# Patient Record
Sex: Male | Born: 1980 | Race: White | Hispanic: No | Marital: Single | Smoking: Never smoker
Health system: Southern US, Community
[De-identification: ages and names within clinical notes are randomized; demographics above are authoritative.]

---

## 2001-03-02 ENCOUNTER — Emergency Department (HOSPITAL_COMMUNITY): Admission: EM | Admit: 2001-03-02 | Discharge: 2001-03-02 | Payer: Self-pay | Admitting: Emergency Medicine

## 2001-03-02 ENCOUNTER — Encounter: Payer: Self-pay | Admitting: Emergency Medicine

## 2004-10-27 ENCOUNTER — Emergency Department (HOSPITAL_COMMUNITY): Admission: EM | Admit: 2004-10-27 | Discharge: 2004-10-27 | Payer: Self-pay | Admitting: Emergency Medicine

## 2013-08-05 ENCOUNTER — Encounter (INDEPENDENT_AMBULATORY_CARE_PROVIDER_SITE_OTHER): Payer: Self-pay

## 2013-08-05 ENCOUNTER — Ambulatory Visit: Payer: Self-pay | Admitting: Family Medicine

## 2013-08-05 ENCOUNTER — Ambulatory Visit (INDEPENDENT_AMBULATORY_CARE_PROVIDER_SITE_OTHER): Payer: Self-pay

## 2013-08-05 VITALS — BP 135/86 | HR 77 | Temp 98.2°F | Ht 68.5 in | Wt 198.2 lb

## 2013-08-05 DIAGNOSIS — F1010 Alcohol abuse, uncomplicated: Secondary | ICD-10-CM

## 2013-08-05 DIAGNOSIS — R1011 Right upper quadrant pain: Secondary | ICD-10-CM

## 2013-08-05 DIAGNOSIS — R1013 Epigastric pain: Secondary | ICD-10-CM

## 2013-08-05 DIAGNOSIS — R0602 Shortness of breath: Secondary | ICD-10-CM

## 2013-08-05 DIAGNOSIS — K625 Hemorrhage of anus and rectum: Secondary | ICD-10-CM

## 2013-08-05 DIAGNOSIS — R5381 Other malaise: Secondary | ICD-10-CM

## 2013-08-05 DIAGNOSIS — R1032 Left lower quadrant pain: Secondary | ICD-10-CM

## 2013-08-05 DIAGNOSIS — R5383 Other fatigue: Secondary | ICD-10-CM

## 2013-08-05 LAB — POCT URINALYSIS DIPSTICK
Bilirubin, UA: NEGATIVE
Glucose, UA: NEGATIVE
Ketones, UA: NEGATIVE
Leukocytes, UA: NEGATIVE
Nitrite, UA: NEGATIVE
Spec Grav, UA: 1.01
Urobilinogen, UA: NEGATIVE
pH, UA: 6

## 2013-08-05 LAB — POCT CBC
Granulocyte percent: 65.9 %G (ref 37–80)
HCT, POC: 43.1 % — AB (ref 43.5–53.7)
Hemoglobin: 14.3 g/dL (ref 14.1–18.1)
Lymph, poc: 2 (ref 0.6–3.4)
MCH, POC: 30.4 pg (ref 27–31.2)
MCHC: 33.1 g/dL (ref 31.8–35.4)
MCV: 91.7 fL (ref 80–97)
MPV: 7.5 fL (ref 0–99.8)
POC Granulocyte: 4.8 (ref 2–6.9)
POC LYMPH PERCENT: 27.5 %L (ref 10–50)
Platelet Count, POC: 272 10*3/uL (ref 142–424)
RBC: 4.7 M/uL (ref 4.69–6.13)
RDW, POC: 12.1 %
WBC: 7.3 10*3/uL (ref 4.6–10.2)

## 2013-08-05 LAB — POCT UA - MICROSCOPIC ONLY
Casts, Ur, LPF, POC: NEGATIVE
Crystals, Ur, HPF, POC: NEGATIVE
Mucus, UA: NEGATIVE
Yeast, UA: NEGATIVE

## 2013-08-05 MED ORDER — VITAMIN B-1 100 MG PO TABS: 100.0000 mg | ORAL_TABLET | Freq: Every day | ORAL | Status: AC

## 2013-08-05 NOTE — Progress Notes (Signed)
   Subjective:    Patient ID: David Landry, male    DOB: 1980-10-26, 33 y.o.   MRN: [id number]  HPI  This 33 y.o. male presents for evaluation of epigastric abdominal pain and right upper quadrant Abdominal discomfort.  He states he has been alcoholic since 33 years old.  He drinks 6 beers and 1/2 fifth a night 7 days a week.  He had some abdominal pain in his RUQ a few years ago after heavy binge drinking.  He states he feels like he has a knot in his epigastric region.  He has been having a lot of right upper quadrant abdominal pains.  He states he feels bad.  He c/o occasional rectal bleeding and difficulty with his bowels he states.  He states it hurts to take deep breath or run because of pain in his right upper quadrant.  Review of Systems C/o abdominal pain   No chest pain, SOB, HA, dizziness, vision change, N/V, diarrhea, constipation, dysuria, urinary urgency or frequency, myalgias, arthralgias or rash.  Objective:   Physical Exam  Vital signs noted  Well developed well nourished male.  HEENT - Head atraumatic Normocephalic                Eyes - PERRLA, Conjuctiva - clear Sclera- Clear EOMI                Ears - EAC's Wnl TM's Wnl Gross Hearing WNL                Throat - oropharanx wnl Respiratory - Lungs CTA bilateral Cardiac - RRR S1 and S2 without murmur GI - Abdomen tenderness with palpation to epigastric region and right upper quadrant, and bowel sounds active x 4 Extremities - No edema. Neuro - Grossly intact.  CXR - No infiltrates KUB - Normal gas pattern  Prelimnary reading by Iverson Alamin     Assessment & Plan:    Abdominal pain, right upper quadrant - Plan: POCT CBC, CMP14+EGFR, Lipase, POCT urinalysis dipstick, Sedimentation rate, US Abdomen Complete, DG Abd 1 View.  Concerned that he may have pancreatitis and recommend no more alcohol.  Abdominal pain, epigastric - Plan: POCT CBC, CMP14+EGFR, Lipase, POCT urinalysis dipstick, POCT UA - Microscopic  Only, Sedimentation rate, US Abdomen Complete, DG Abd 1 View. Dexilant $RemoveBefore'60mg'[id number]$  one po qd #6 samples.  Other malaise and fatigue - Plan: POCT CBC, CMP14+EGFR, Lipase, POCT urinalysis dipstick, POCT UA - Microscopic Only, Sedimentation rate, Thyroid Panel With TSH  Rectal bleeding - Plan: POCT CBC, CMP14+EGFR, Lipase, POCT urinalysis dipstick, POCT UA - Microscopic Only, US Abdomen Complete, POCT INR  Shortness of breath - Plan: POCT CBC, CMP14+EGFR, Lipase, POCT urinalysis dipstick, POCT UA - Microscopic Only, Sedimentation rate, DG Chest 2 View  Alcohol abuse - Plan: POCT CBC, CMP14+EGFR, Lipase, POCT urinalysis dipstick, POCT UA - Microscopic Only, Folate Strongly encouraged patient that he needs to quit drinking and that he may die if he does not stop. Recommend rehab if cannot quit drinking.  Follow up in 4 days  Lysbeth Penner FNP

## 2013-08-06 ENCOUNTER — Telehealth: Payer: Self-pay | Admitting: Family Medicine

## 2013-08-06 ENCOUNTER — Telehealth: Payer: Self-pay | Admitting: *Deleted

## 2013-08-06 LAB — CMP14+EGFR
ALT: 53 IU/L — ABNORMAL HIGH (ref 0–44)
AST: 44 IU/L — ABNORMAL HIGH (ref 0–40)
Albumin/Globulin Ratio: 2.1 (ref 1.1–2.5)
Albumin: 5.1 g/dL (ref 3.5–5.5)
Alkaline Phosphatase: 63 IU/L (ref 39–117)
BUN/Creatinine Ratio: 12 (ref 8–19)
BUN: 13 mg/dL (ref 6–20)
CO2: 26 mmol/L (ref 18–29)
Calcium: 10 mg/dL (ref 8.7–10.2)
Chloride: 97 mmol/L (ref 97–108)
Creatinine, Ser: 1.08 mg/dL (ref 0.76–1.27)
GFR calc Af Amer: 103 mL/min/{1.73_m2} (ref >59)
GFR calc non Af Amer: 89 mL/min/{1.73_m2} (ref >59)
Globulin, Total: 2.4 g/dL (ref 1.5–4.5)
Glucose: 85 mg/dL (ref 65–99)
Potassium: 4.8 mmol/L (ref 3.5–5.2)
Sodium: 140 mmol/L (ref 134–144)
Total Bilirubin: 0.4 mg/dL (ref 0.0–1.2)
Total Protein: 7.5 g/dL (ref 6.0–8.5)

## 2013-08-06 LAB — SEDIMENTATION RATE

## 2013-08-06 LAB — LIPASE: Lipase: 21 U/L (ref 0–59)

## 2013-08-06 NOTE — Addendum Note (Signed)
Addended by: Prescott GumLAND, Ezekial Arns M on: 08/06/2013 04:45 PM   Modules accepted: Orders

## 2013-08-06 NOTE — Telephone Encounter (Signed)
Message copied by Gwenith DailyHUDY, KRISTEN N on Tue Aug 06, 2013 12:23 PM ------      Message from: Deatra CanterXFORD, WILLIAM J      Created: Tue Aug 06, 2013  8:29 AM       LFT elevated and need to get Liver US.  Need hepatitis panel.  4 plus protien in urine and moderate blood.  Would like to get 24 hour urine for protien and then also get UA cx and also repeat UA.      Patient is coming in Friday for follow up and will discuss with him at follow up. ------

## 2013-08-06 NOTE — Telephone Encounter (Signed)
appt scheduled for friday 

## 2013-08-07 LAB — HEPATITIS PANEL, ACUTE
Hep A IgM: NEGATIVE
Hep B C IgM: NEGATIVE
Hep C Virus Ab: <0.1 s/co ratio (ref 0.0–0.9)
Hepatitis B Surface Ag: NEGATIVE

## 2013-08-07 LAB — URINE CULTURE: Organism ID, Bacteria: NO GROWTH

## 2013-08-07 LAB — SEDIMENTATION RATE: Sed Rate: 6 mm/hr (ref 0–15)

## 2013-08-08 ENCOUNTER — Other Ambulatory Visit: Payer: Self-pay

## 2013-08-08 ENCOUNTER — Ambulatory Visit (HOSPITAL_COMMUNITY): Payer: Self-pay

## 2013-08-08 DIAGNOSIS — F1010 Alcohol abuse, uncomplicated: Secondary | ICD-10-CM

## 2013-08-08 DIAGNOSIS — K625 Hemorrhage of anus and rectum: Secondary | ICD-10-CM

## 2013-08-08 DIAGNOSIS — R1013 Epigastric pain: Secondary | ICD-10-CM

## 2013-08-08 DIAGNOSIS — R5381 Other malaise: Secondary | ICD-10-CM

## 2013-08-08 DIAGNOSIS — R5383 Other fatigue: Secondary | ICD-10-CM

## 2013-08-08 DIAGNOSIS — R0602 Shortness of breath: Secondary | ICD-10-CM

## 2013-08-08 DIAGNOSIS — R1011 Right upper quadrant pain: Secondary | ICD-10-CM

## 2013-08-08 DIAGNOSIS — R1032 Left lower quadrant pain: Secondary | ICD-10-CM

## 2013-08-08 NOTE — Addendum Note (Signed)
Addended by: Prescott GumLAND, Ever Gustafson M on: 08/08/2013 04:23 PM   Modules accepted: Orders

## 2013-08-09 ENCOUNTER — Encounter: Payer: Self-pay | Admitting: Family Medicine

## 2013-08-09 ENCOUNTER — Ambulatory Visit: Payer: Self-pay | Admitting: Family Medicine

## 2013-08-09 VITALS — BP 137/90 | HR 75 | Temp 97.3°F | Ht 68.5 in | Wt 199.0 lb

## 2013-08-09 DIAGNOSIS — G8929 Other chronic pain: Secondary | ICD-10-CM

## 2013-08-09 DIAGNOSIS — R1011 Right upper quadrant pain: Secondary | ICD-10-CM

## 2013-08-09 DIAGNOSIS — N419 Inflammatory disease of prostate, unspecified: Secondary | ICD-10-CM

## 2013-08-09 DIAGNOSIS — F1010 Alcohol abuse, uncomplicated: Secondary | ICD-10-CM

## 2013-08-09 LAB — PROTEIN, URINE, 24 HOUR
Protein, 24H Urine: 124.8 mg/24 hr (ref 30.0–150.0)
Protein, Ur: 5.2 mg/dL (ref 0.0–15.0)

## 2013-08-09 MED ORDER — HYDROCODONE-ACETAMINOPHEN 5-325 MG PO TABS: 1.0000 | ORAL_TABLET | Freq: Four times a day (QID) | ORAL | Status: DC | PRN

## 2013-08-09 MED ORDER — CIPROFLOXACIN HCL 500 MG PO TABS: 500.0000 mg | ORAL_TABLET | Freq: Two times a day (BID) | ORAL | Status: AC

## 2013-08-09 MED ORDER — FOLIC ACID 1 MG PO TABS: 1.0000 mg | ORAL_TABLET | Freq: Every day | ORAL | Status: AC

## 2013-08-09 NOTE — Progress Notes (Signed)
   Subjective:    Patient ID: David Landry, male    DOB: 1980-05-24, 33 y.o.   MRN: [id number]  HPI This 33 y.o. male presents for evaluation of persistent abdominal pain in epigastric and right upper quadrant of abdomen.  Patient admits to heavy ETOH use and he has been trying to cut back.  He  Did have labs drawn which show mildly elevated transaminases and urinalysis showing proteinuria And hematuria.  He states he has been having some dysuria and frequency.  He has hx of prostatitis and feels like it is coming back.  He has persistent epigastric pain and right upper quadrant abdominal pain.   He had US scheduled but missed it.  He is still drinking alcohol and states he has cut back but is drinking the rest in the fridge and will not buy anymore.  He had elevated protien in his urine and a 24 hour urinalysis for protein.  Review of Systems C/o abdominal pain, dysuria, and fatigue   No chest pain, SOB, HA, dizziness, vision change, N/V, diarrhea, constipation, dysuria, urinary urgency or frequency, myalgias, arthralgias or rash.  Objective:   Physical Exam  Vital signs noted  Well developed well nourished male.  HEENT - Head atraumatic Normocephalic                Eyes - PERRLA, Conjuctiva - clear Sclera- Clear EOMI                Ears - EAC's Wnl TM's Wnl Gross Hearing WNL                Throat - oropharanx wnl Respiratory - Lungs CTA bilateral Cardiac - RRR S1 and S2 without murmur GI - Abdomen tender epigastric and RUQ.  Positive McMurray and bowel sounds active x 4 Extremities - No edema. Neuro - Grossly intact.  Results for orders placed in visit on 08/08/13  PROTEIN, URINE, 24 HOUR      Result Value Ref Range   Protein, Ur 5.2  0.0 - 15.0 mg/dL   Protein, 78G24H Urine [id number]  30.0 - 150.0 mg/24 hr      Assessment & Plan:  Alcohol abuse - Plan: folic acid (FOLVITE) 1 MG tablet.  Discussed at length that he needs to quit drinking alcohol.  Prostatitis - Plan:  ciprofloxacin (CIPRO) 500 MG tablet bid x 2 weeks.  He is having similar sx's and will go ahead and tx empirically.  Abdominal pain, chronic, right upper quadrant - Plan: HYDROcodone-acetaminophen (NORCO) 5-325 MG per tablet.  Get abdominal US ASAP.  Called [facility] and scheduled it for Tuesday am And need him to make this appointment.  Follow up in ED if pain worsens.  Concerned it may be gallbladder.  Labs show no infection.    Gerd - Continue Dexilant 60mg  po qd samples were given last visit.  Proteinuria - Protein within normal limits.  Since he is having UA sx's will tx with abx's and repeat UA after tx.  Follow up after BG US.  David CanterWilliam J Haylee Mcanany FNP

## 2013-08-12 ENCOUNTER — Telehealth: Payer: Self-pay | Admitting: Family Medicine

## 2013-08-12 NOTE — Telephone Encounter (Signed)
appt given for wed with bill 

## 2013-08-13 ENCOUNTER — Ambulatory Visit (HOSPITAL_COMMUNITY)
Admission: RE | Admit: 2013-08-13 | Discharge: 2013-08-13 | Disposition: A | Discharge status: Home / Self Care | Payer: Self-pay | Source: Ambulatory Visit | Attending: Family Medicine | Admitting: Family Medicine

## 2013-08-13 DIAGNOSIS — K7689 Other specified diseases of liver: Secondary | ICD-10-CM | POA: Insufficient documentation

## 2013-08-13 DIAGNOSIS — R1013 Epigastric pain: Secondary | ICD-10-CM | POA: Insufficient documentation

## 2013-08-13 DIAGNOSIS — R1011 Right upper quadrant pain: Secondary | ICD-10-CM | POA: Insufficient documentation

## 2013-08-13 DIAGNOSIS — K625 Hemorrhage of anus and rectum: Secondary | ICD-10-CM

## 2013-08-14 ENCOUNTER — Ambulatory Visit (INDEPENDENT_AMBULATORY_CARE_PROVIDER_SITE_OTHER): Payer: Self-pay | Admitting: Family Medicine

## 2013-08-14 ENCOUNTER — Encounter: Payer: Self-pay | Admitting: Family Medicine

## 2013-08-14 VITALS — BP 130/82 | HR 76 | Temp 98.2°F | Ht 68.5 in | Wt 199.2 lb

## 2013-08-14 DIAGNOSIS — R1011 Right upper quadrant pain: Secondary | ICD-10-CM

## 2013-08-14 DIAGNOSIS — G8929 Other chronic pain: Secondary | ICD-10-CM

## 2013-08-14 MED ORDER — HYDROCODONE-ACETAMINOPHEN 5-325 MG PO TABS: 1.0000 | ORAL_TABLET | Freq: Four times a day (QID) | ORAL | Status: AC | PRN

## 2013-08-14 NOTE — Progress Notes (Signed)
   Subjective:    Patient ID: David Landry, male    DOB: 11/26/1980, 33 y.o.   MRN: [id number]  HPI This 33 y.o. male presents for evaluation of persistent right upper quadrant and epigastric abdominal Discomfort.  He had labs drawn which showed mildly elevated ALT and proteinuria.  He was having some dysuria and was tx'd with cipro and is doing better.  He has had US which shows hepatomegaly and steotorrhea.  He is still drinking.  He states he quit drinking liquor but is still drinking beer.  He still Has abdominal pain.  His lipase was normal.    Review of Systems C/o abdominal pain   No chest pain, SOB, HA, dizziness, vision change, N/V, diarrhea, constipation, dysuria, urinary urgency or frequency, myalgias, arthralgias or rash.  Objective:   Physical Exam Vital signs noted  Well developed well nourished male.  HEENT - Head atraumatic Normocephalic                Eyes - PERRLA, Conjuctiva - clear Sclera- Clear EOMI                Ears - EAC's Wnl TM's Wnl Gross Hearing WNL                Throat - oropharanx wnl Respiratory - Lungs CTA bilateral Cardiac - RRR S1 and S2 without murmur GI - Abdomen soft tender RUQ and murphy's present       Assessment & Plan:  Abdominal pain, chronic, right upper quadrant - Plan: Ambulatory referral to Gastroenterology, HYDROcodone-acetaminophen (NORCO) 5-325 MG per tablet  Deatra CanterWilliam J Oxford FNP

## 2013-08-15 ENCOUNTER — Encounter: Payer: Self-pay | Admitting: Gastroenterology

## 2013-10-09 ENCOUNTER — Ambulatory Visit: Payer: Self-pay | Admitting: Gastroenterology

## 2014-08-23 ENCOUNTER — Other Ambulatory Visit: Payer: Self-pay | Admitting: Family Medicine

## 2014-10-23 IMAGING — US US ABDOMEN COMPLETE
1 series · 14 of 25 positions shown · non-contrast
Comparison: None.

CLINICAL DATA: Mid epigastric and right upper quadrant pain

EXAM:
ULTRASOUND ABDOMEN COMPLETE

[Series 1: us abdomen complete · 0.24mm/px · 14 of 99 slices shown]
[im 1/99]
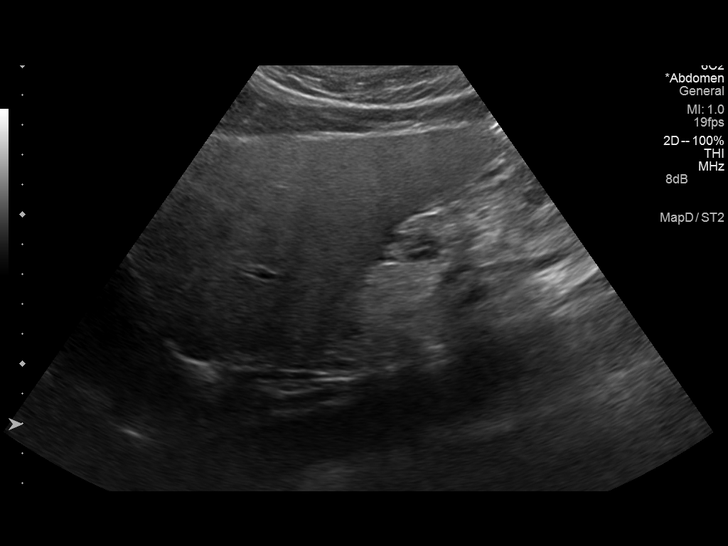
[im 9/99]
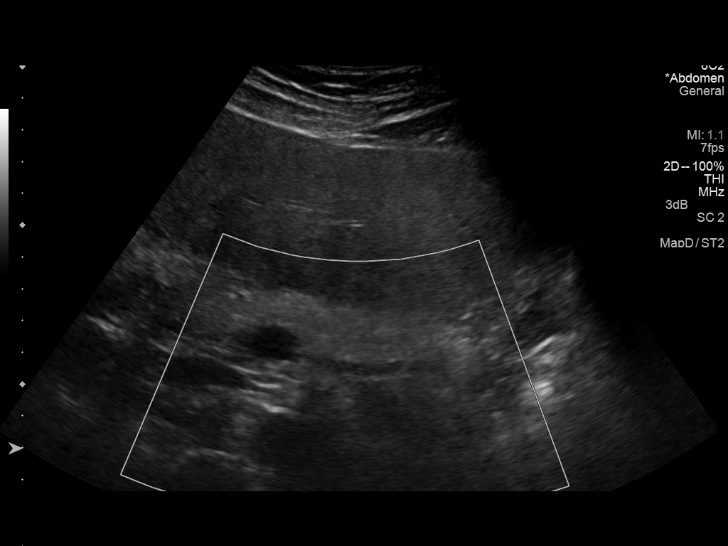
[im 17/99]
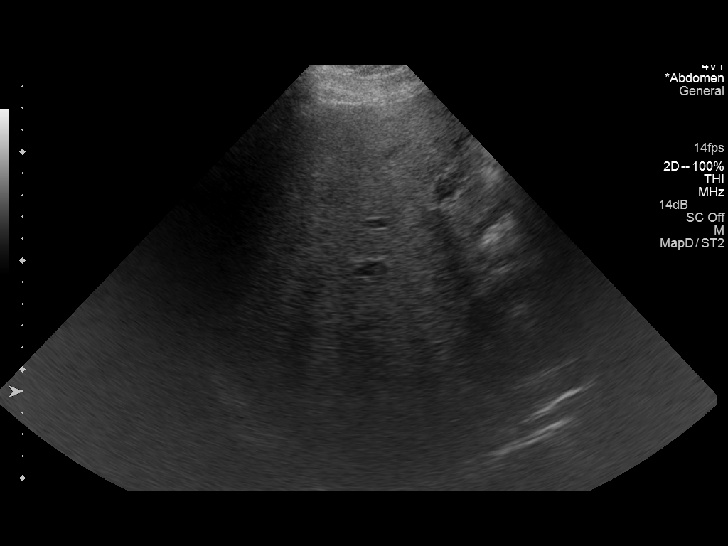
[im 25/99]
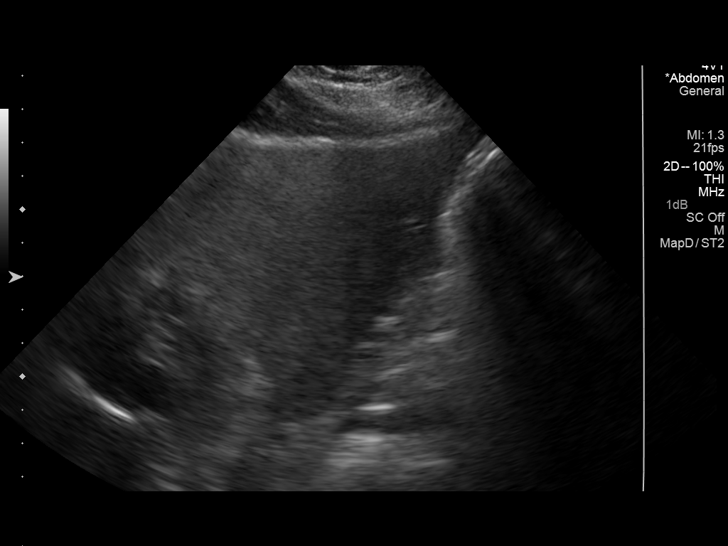
[im 33/99]
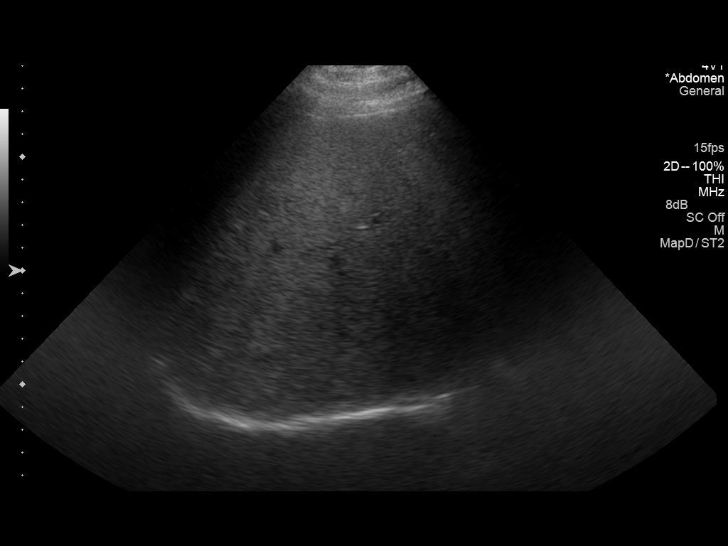
[im 37/99]
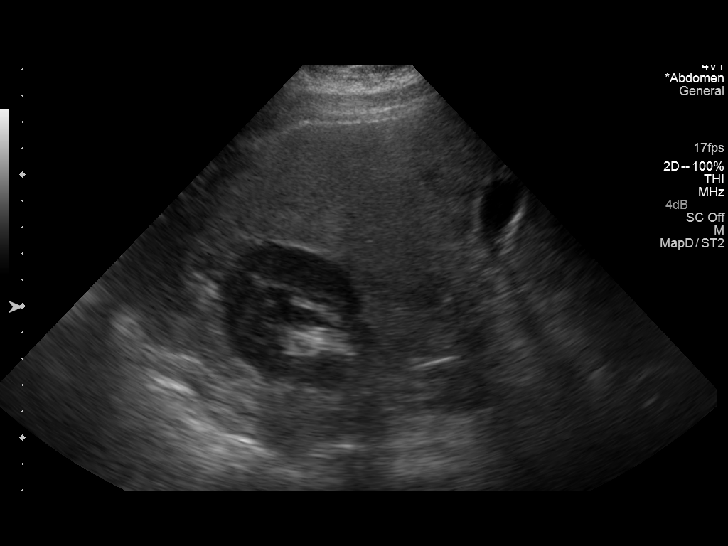
[im 45/99]
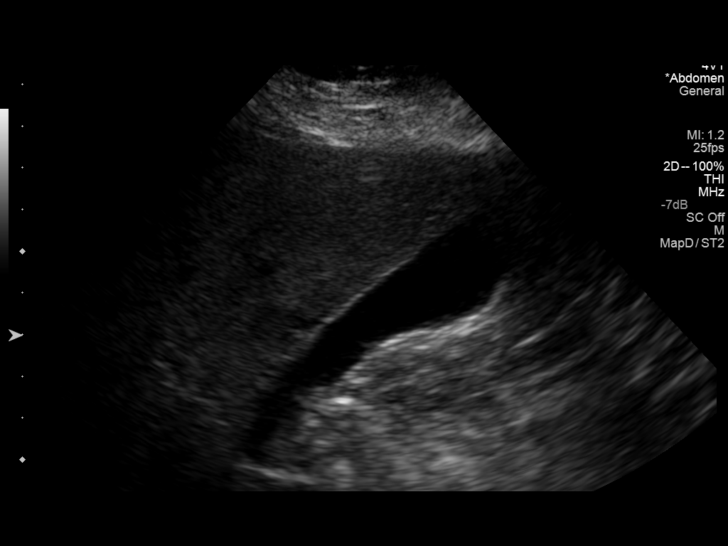
[im 54/99]
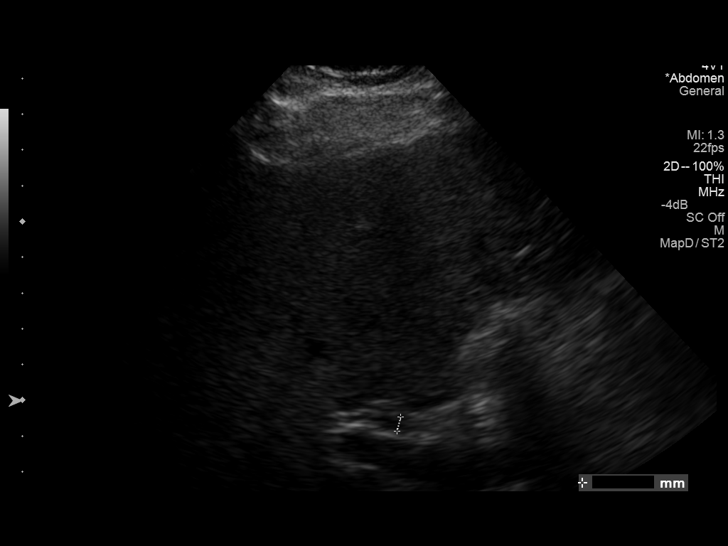
[im 62/99]
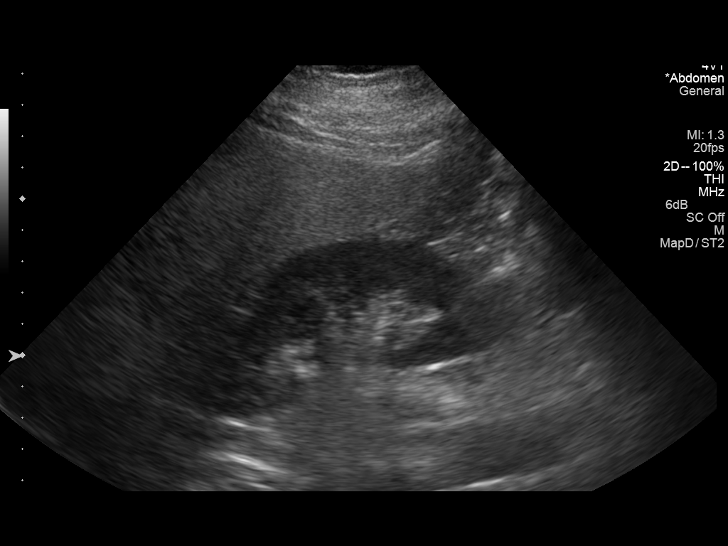
[im 66/99]
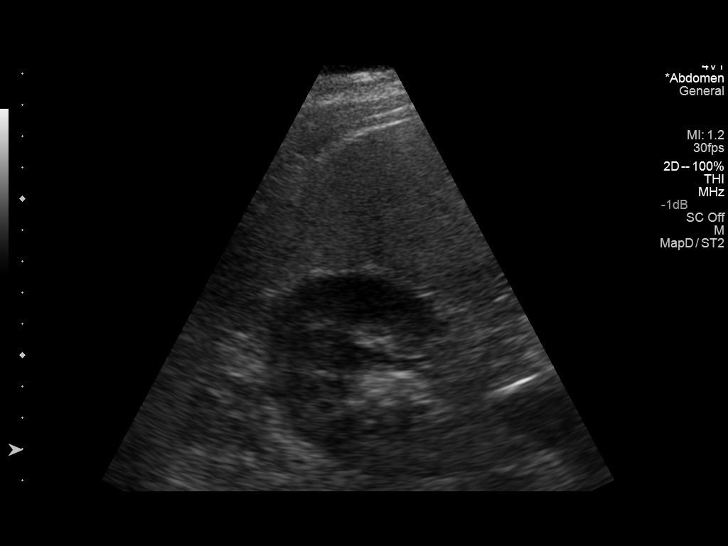
[im 74/99]
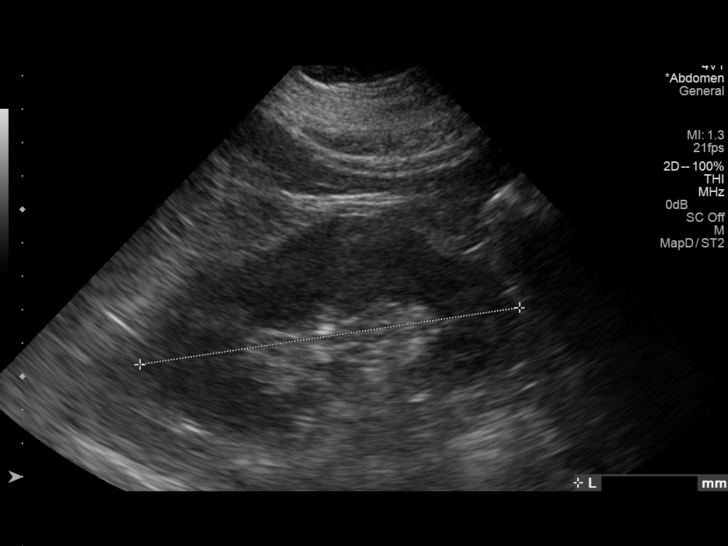
[im 82/99]
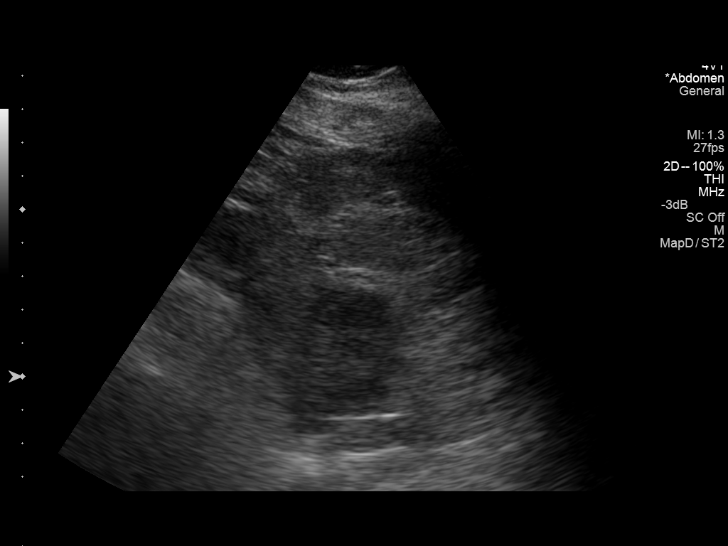
[im 90/99]
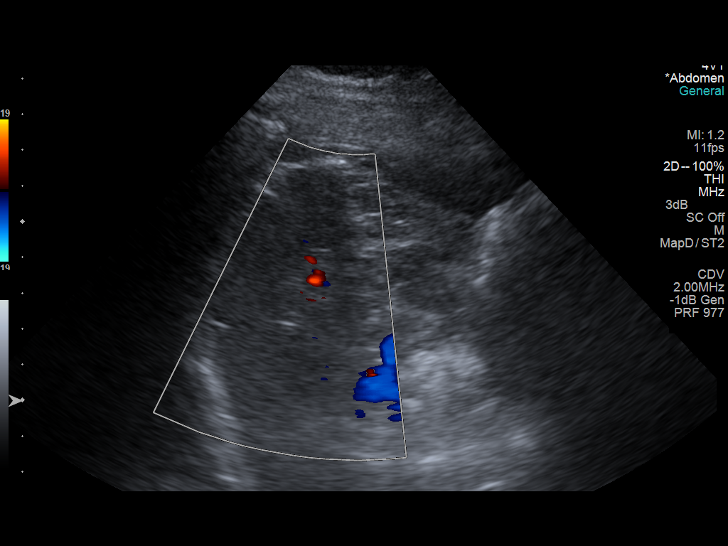
[im 99/99]
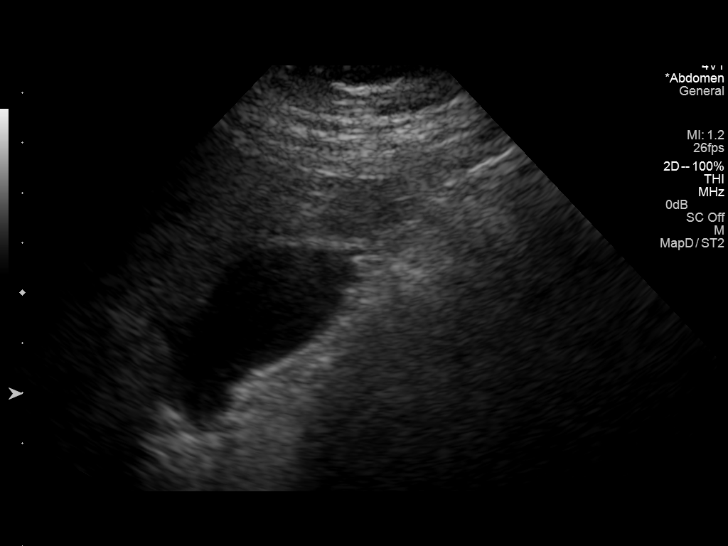

[14 of 25 positions shown; findings below may reference images not displayed]

FINDINGS: Gallbladder:

No gallstones or wall thickening visualized. No sonographic Murphy
sign noted.

Common bile duct:

Diameter: 4.1 mm

Liver:

Echogenic. Borderline enlarged. No liver mass or focal lesion.
Hepatopetal flow was documented in the portal vein.

IVC:

No abnormality visualized.

Pancreas:

Visualized portion unremarkable.

Spleen:

Size and appearance within normal limits.

Right Kidney:

Length: 10.4 cm. Echogenicity within normal limits. No mass or
hydronephrosis visualized.

Left Kidney:

Length: 11.5 cm. Echogenicity within normal limits. No mass or
hydronephrosis visualized.

Abdominal aorta:

No aneurysm visualized.

Other findings:

None.
IMPRESSION: 1. No acute findings.  Normal gallbladder.  No bile duct dilation.
2. Hepatic steatosis and borderline hepatomegaly.
3. No other abnormalities.

## 2021-09-23 ENCOUNTER — Other Ambulatory Visit: Payer: Self-pay | Admitting: Student

## 2021-09-23 ENCOUNTER — Other Ambulatory Visit (HOSPITAL_COMMUNITY): Payer: Self-pay | Admitting: Student

## 2021-09-23 DIAGNOSIS — R109 Unspecified abdominal pain: Secondary | ICD-10-CM

## 2021-10-04 ENCOUNTER — Ambulatory Visit (HOSPITAL_COMMUNITY)
Admission: RE | Admit: 2021-10-04 | Discharge: 2021-10-04 | Disposition: A | Discharge status: Home / Self Care | Payer: Self-pay | Source: Ambulatory Visit | Attending: Student | Admitting: Student

## 2021-10-04 DIAGNOSIS — R109 Unspecified abdominal pain: Secondary | ICD-10-CM | POA: Insufficient documentation

## 2022-12-14 IMAGING — US US ABDOMEN COMPLETE
1 series · 14 of 25 positions shown · non-contrast
Comparison: Abdominal ultrasound 08/13/2013

CLINICAL DATA: Abdominal pain

EXAM:
ABDOMEN ULTRASOUND COMPLETE

[Series 1: us abdomen complete · 14 of 212 slices shown]
[im 1/212]
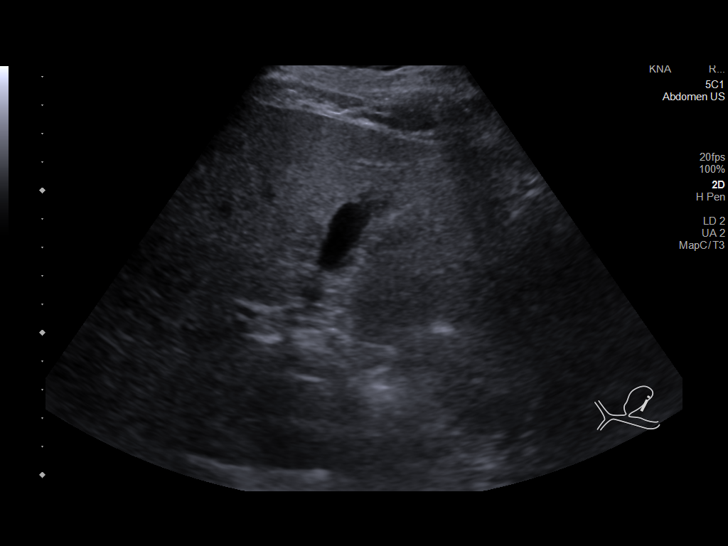
[im 18/212]
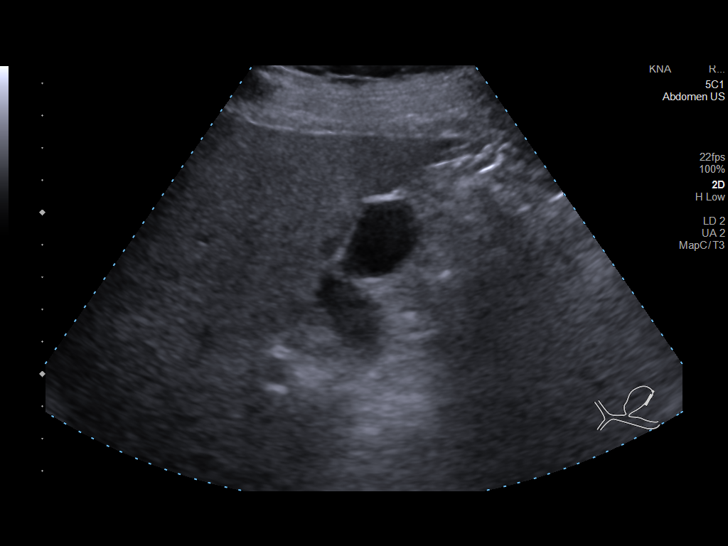
[im 36/212]
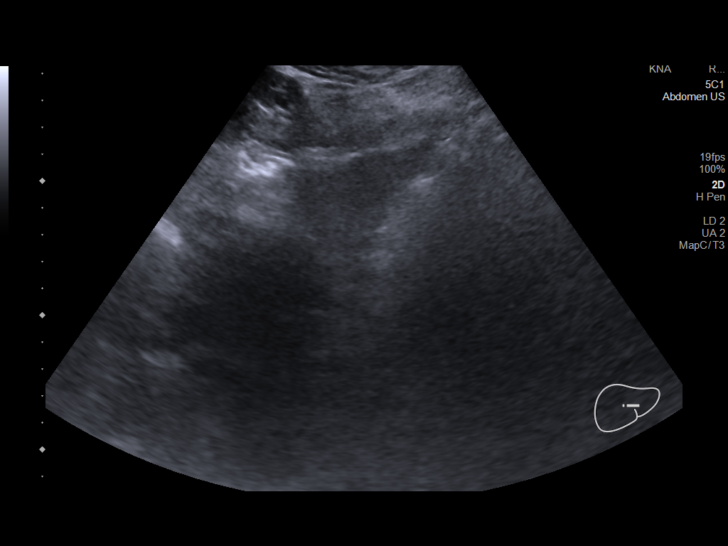
[im 53/212]
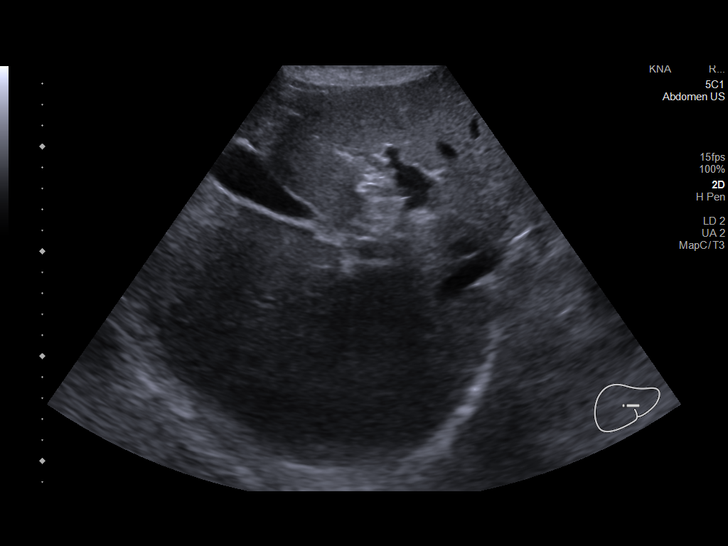
[im 71/212]
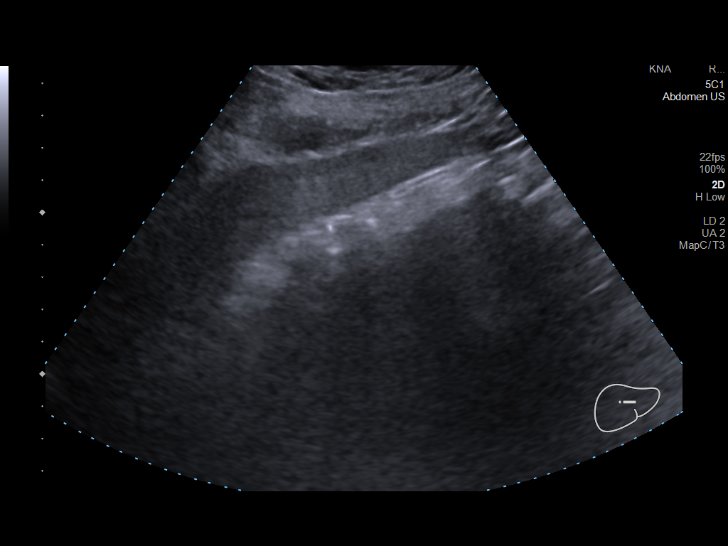
[im 80/212]
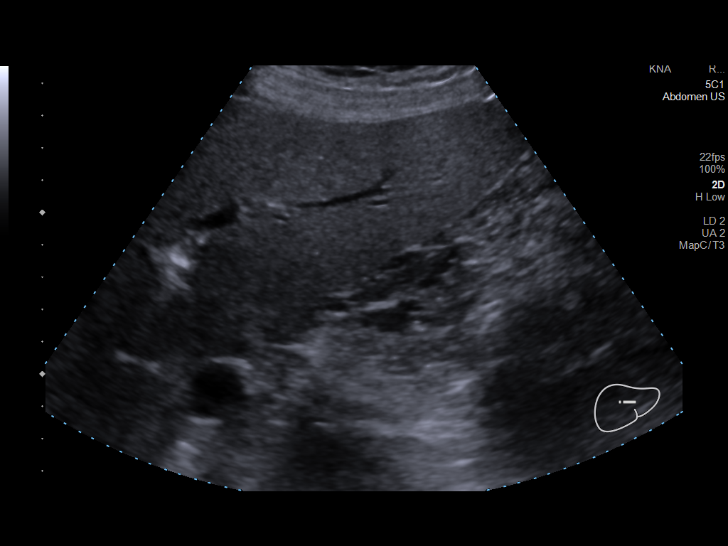
[im 97/212]
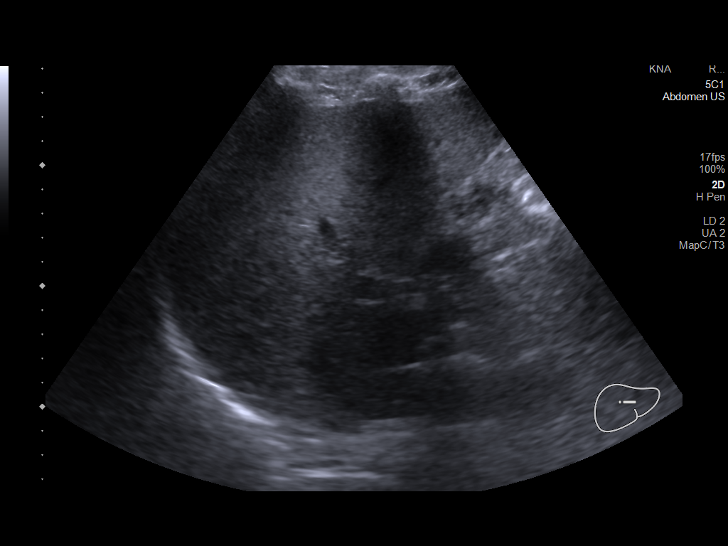
[im 115/212]
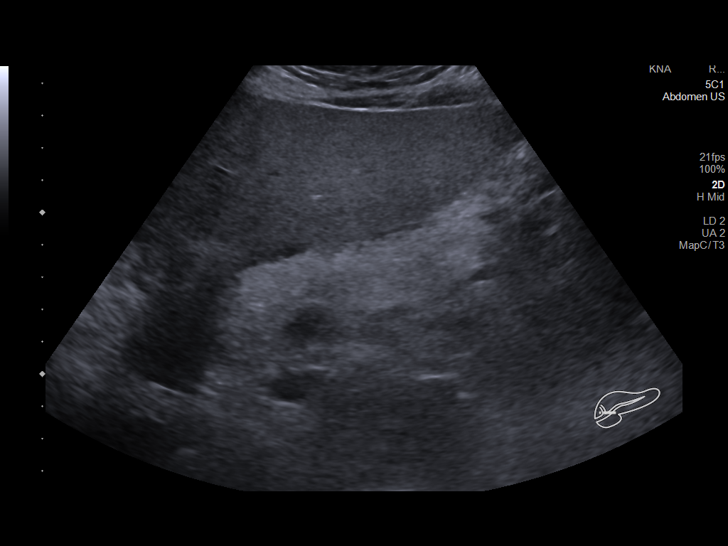
[im 132/212]
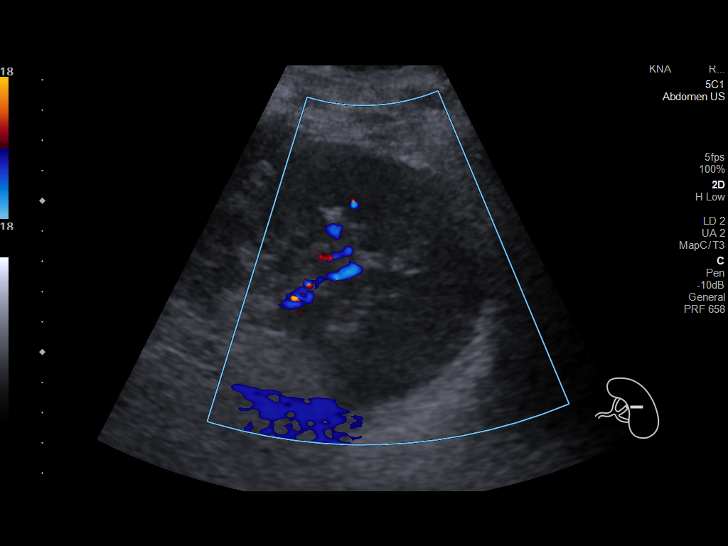
[im 141/212]
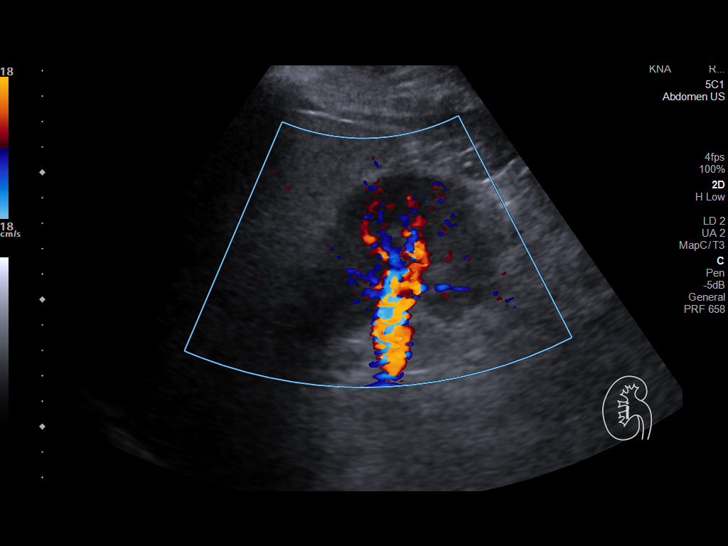
[im 159/212]
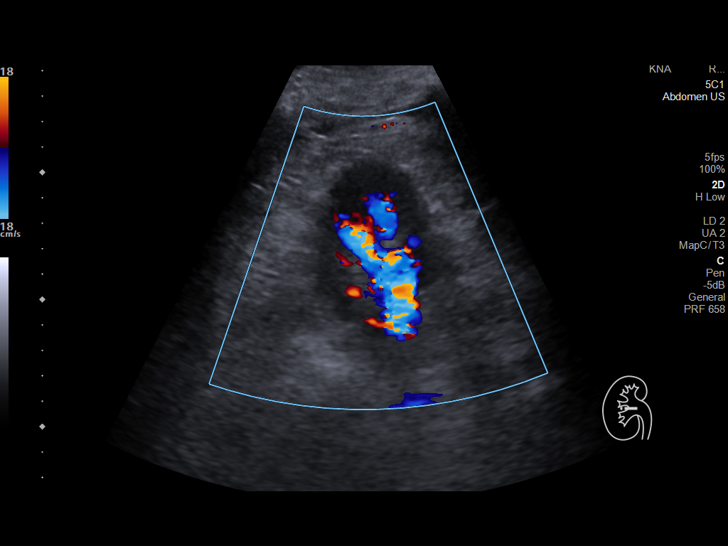
[im 176/212]
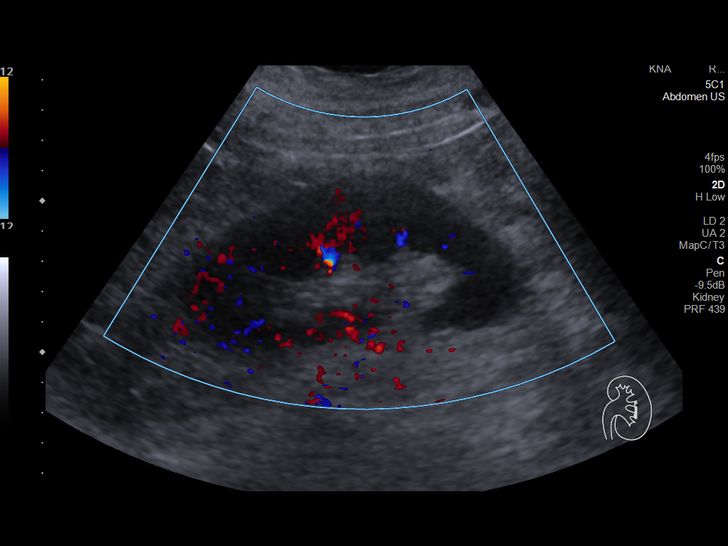
[im 194/212]
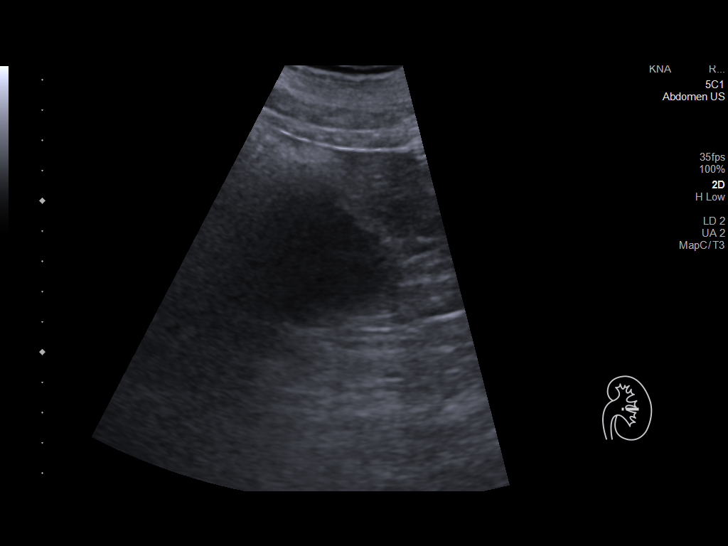
[im 212/212]
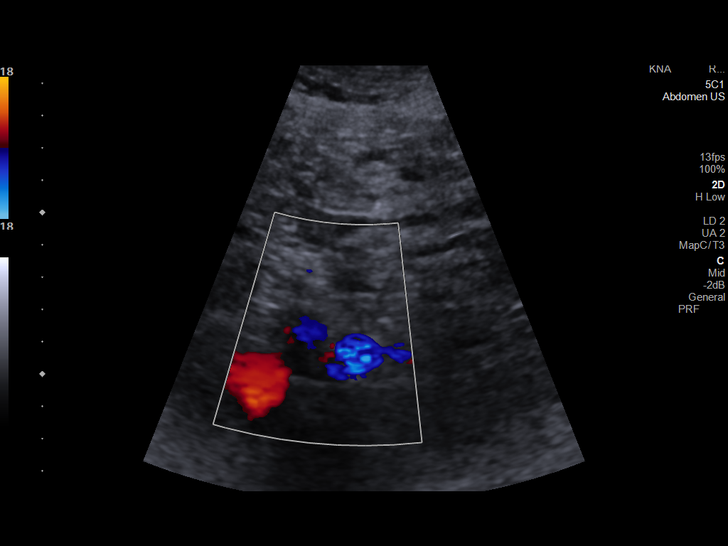

[14 of 25 positions shown; findings below may reference images not displayed]

FINDINGS: Gallbladder: No gallstones or wall thickening visualized. No
sonographic Murphy sign noted by sonographer.

Common bile duct: Diameter: 6 mm

Liver: Coarse, increased echogenicity of the parenchyma with no
focal mass identified. Portal vein is patent on color Doppler
imaging with normal direction of blood flow towards the liver.

IVC: No abnormality visualized.

Pancreas: Visualized portion unremarkable.

Spleen: Size and appearance within normal limits.

Right Kidney: Length: 11.2 cm. Echogenicity within normal limits. No
mass or hydronephrosis visualized.

Left Kidney: Length: 11.4 cm. Echogenicity within normal limits. No
mass or hydronephrosis visualized.

Abdominal aorta: No aneurysm visualized.

Other findings: None.
IMPRESSION: Abnormal appearance of the liver parenchyma suggesting hepatic
steatosis and/or other hepatocellular disease.
